# Patient Record
Sex: Male | Born: 1995 | Hispanic: Yes | Marital: Married | Smoking: Never smoker
Health system: Southern US, Community
[De-identification: ages and names within clinical notes are randomized; demographics above are authoritative.]

## PROBLEM LIST (undated history)

## (undated) DIAGNOSIS — J45909 Unspecified asthma, uncomplicated: Secondary | ICD-10-CM

## (undated) HISTORY — PX: WISDOM TOOTH EXTRACTION: SHX21

---

## 2017-06-29 ENCOUNTER — Encounter: Payer: Self-pay | Admitting: Emergency Medicine

## 2017-06-29 ENCOUNTER — Other Ambulatory Visit: Payer: Self-pay

## 2017-06-29 ENCOUNTER — Emergency Department
Admission: EM | Admit: 2017-06-29 | Discharge: 2017-06-29 | Disposition: A | Discharge status: Home / Self Care | Payer: Self-pay | Attending: Emergency Medicine | Admitting: Emergency Medicine

## 2017-06-29 ENCOUNTER — Emergency Department: Payer: Self-pay

## 2017-06-29 DIAGNOSIS — N50812 Left testicular pain: Secondary | ICD-10-CM | POA: Insufficient documentation

## 2017-06-29 LAB — URINALYSIS, COMPLETE (UACMP) WITH MICROSCOPIC
BILIRUBIN URINE: NEGATIVE
Glucose, UA: NEGATIVE mg/dL
Hgb urine dipstick: NEGATIVE
Ketones, ur: NEGATIVE mg/dL
Leukocytes, UA: NEGATIVE
NITRITE: NEGATIVE
PH: 6 (ref 5.0–8.0)
Protein, ur: NEGATIVE mg/dL
SPECIFIC GRAVITY, URINE: 1.025 (ref 1.005–1.030)

## 2017-06-29 LAB — CHLAMYDIA/NGC RT PCR (ARMC ONLY)
CHLAMYDIA TR: NOT DETECTED
N gonorrhoeae: NOT DETECTED

## 2017-06-29 NOTE — ED Notes (Signed)

## 2017-06-29 NOTE — Discharge Instructions (Addendum)
Sometimes when you have pain in the testicle it could be that the testicle has twisted and cut off the blood flow.  If you develop bad pain in the testicle again do not wait more than 10 or 15 minutes to see if it gets better.  Please come right to the emergency room and have another ultrasound and make sure there is no torsion or twisting of the testicle.  If you had a testicular torsion and it was not fixed within a few hours the testicle could die.  Right now there is no sign of anything wrong going on.  Just return as needed.

## 2017-06-29 NOTE — ED Notes (Signed)
First Nurse Note: Patient to US via WC. 

## 2017-06-29 NOTE — ED Triage Notes (Signed)
Pt states that he has been having pain in his left testicle since Tuesday, initially the pain was much worse. Pt concerned that it is still having pain

## 2017-06-29 NOTE — ED Provider Notes (Addendum)
[facility] Emergency Department Provider Note   ____________________________________________   First MD Initiated Contact with Patient 06/29/17 1125     (approximate)  I have reviewed the triage vital signs and the nursing notes.   HISTORY  Chief Complaint Testicle Pain   HPI Seth Dixon is a 22 y.o. male who reports sudden onset of severe pain in his left testicle Tuesday as he was taking off his pants from work.  It has since resolved.  There is no pain anymore.  Is no dysuria no other complaints ultrasound of the testicle was normal urinalysis is essentially normal   History reviewed. No pertinent past medical history.  There are no active problems to display for this patient.   History reviewed. No pertinent surgical history.  Prior to Admission medications   Not on File    Allergies Patient has no known allergies.  History reviewed. No pertinent family history.  Social History Social History   Tobacco Use  . Smoking status: Not on file  Substance Use Topics  . Alcohol use: Not on file  . Drug use: Not on file    Review of Systems  Constitutional: No fever/chills Eyes: No visual changes. ENT: No sore throat. Cardiovascular: Denies chest pain. Respiratory: Denies shortness of breath. Gastrointestinal: No abdominal pain.  No nausea, no vomiting.  No diarrhea.  No constipation. Genitourinary: Negative for dysuria. Musculoskeletal: Negative for back pain. Skin: Negative for rash. Neurological: Negative for headaches, focal weakness ____________________________________________   PHYSICAL EXAM:  VITAL SIGNS: ED Triage Vitals  Enc Vitals Group     BP 06/29/17 0937 (!) 112/47     Pulse Rate 06/29/17 0937 68     Resp 06/29/17 0937 18     Temp 06/29/17 0937 98 F (36.7 C)     Temp Source 06/29/17 0937 Oral     SpO2 06/29/17 0937 100 %     Weight 06/29/17 0938 202 lb (91.6 kg)     Height 06/29/17 0938 5\' 7"  (1.702 m)      Head Circumference --      Peak Flow --      Pain Score 06/29/17 0938 3     Pain Loc --      Pain Edu? --      Excl. in GC? --     Constitutional: Alert and oriented. Well appearing and in no acute distress. Eyes: Conjunctivae are normal.  Head: Atraumatic. Nose: No congestion/rhinnorhea. Mouth/Throat: Mucous membranes are moist.   Neck: No stridor.   Cardiovascular:  Good peripheral circulation. Respiratory: Normal respiratory effort.  No retractions. Gastrointestinal: Soft and nontender.  No masses no CVA tenderness Genitourinary: Normal uncircumcised male normal penis normal testicles not tender no masses Musculoskeletal: No lower extremity tenderness nor edema.   Neurologic:  Normal speech and language. No gross focal neurologic deficits are appreciated. Skin:  Skin is warm, dry and intact. No rash noted. Psychiatric: Mood and affect are normal. Speech and behavior are normal.  ____________________________________________   LABS (all labs ordered are listed, but only abnormal results are displayed)  Labs Reviewed  URINALYSIS, COMPLETE (UACMP) WITH MICROSCOPIC - Abnormal; Notable for the following components:      Result Value   Color, Urine YELLOW (*)    APPearance HAZY (*)    All other components within normal limits  CHLAMYDIA/NGC RT PCR ([facility] ONLY)   ____________________________________________  EKG   ____________________________________________  RADIOLOGY  ED MD interpretation: Essentially normal testicular ultrasound  Official radiology  report(s): Koreas Scrotum W/doppler  Result Date: 06/29/2017 CLINICAL DATA:  Left testicle pain. EXAM: SCROTAL ULTRASOUND DOPPLER ULTRASOUND OF THE TESTICLES TECHNIQUE: Complete ultrasound examination of the testicles, epididymis, and other scrotal structures was performed. Color and spectral Doppler ultrasound were also utilized to evaluate blood flow to the testicles. COMPARISON:  None. FINDINGS: Right testicle  Measurements: 3.9 x 2.6 x 3.4 cm. No mass or microlithiasis visualized. Left testicle Measurements: 5.2 x 2.4 x 3.1 cm. No mass visualized. Single punctate 2 mm microlith within the testicle. Right epididymis:  Normal in size and appearance. Left epididymis:  Normal in size and appearance. Hydrocele:  None visualized. Varicocele:  Small left varicocele. Pulsed Doppler interrogation of both testes demonstrates normal low resistance arterial and venous waveforms bilaterally. IMPRESSION: 1. No acute abnormality. 2. Small left varicocele. Electronically Signed   By: Obie DredgeWilliam T Derry M.D.   On: 06/29/2017 10:44    ____________________________________________   PROCEDURES  Procedure(s) performed:   Procedures  Critical Care performed:   ____________________________________________   INITIAL IMPRESSION / ASSESSMENT AND PLAN / ED COURSE           ____________________________________________   FINAL CLINICAL IMPRESSION(S) / ED DIAGNOSES  Final diagnoses:  Testicular pain, left     ED Discharge Orders    None       Note:  This document was prepared using Dragon voice recognition software and may include unintentional dictation errors.    Arnaldo NatalMalinda, Zniya Cottone F, MD 06/29/17 1143    Arnaldo NatalMalinda, Halea Lieb F, MD 06/29/17 1145

## 2017-11-01 ENCOUNTER — Other Ambulatory Visit: Payer: Self-pay

## 2017-11-01 ENCOUNTER — Ambulatory Visit
Admission: EM | Admit: 2017-11-01 | Discharge: 2017-11-01 | Disposition: A | Discharge status: Home / Self Care | Payer: Self-pay | Attending: Family Medicine | Admitting: Family Medicine

## 2017-11-01 ENCOUNTER — Encounter: Payer: Self-pay | Admitting: Emergency Medicine

## 2017-11-01 DIAGNOSIS — R062 Wheezing: Secondary | ICD-10-CM

## 2017-11-01 DIAGNOSIS — J069 Acute upper respiratory infection, unspecified: Secondary | ICD-10-CM

## 2017-11-01 HISTORY — DX: Unspecified asthma, uncomplicated: J45.909

## 2017-11-01 MED ORDER — AZITHROMYCIN 250 MG PO TABS: ORAL_TABLET | ORAL | 0 refills | Status: DC

## 2017-11-01 MED ORDER — ALBUTEROL SULFATE HFA 108 (90 BASE) MCG/ACT IN AERS: 2.0000 | INHALATION_SPRAY | RESPIRATORY_TRACT | 0 refills | Status: DC | PRN

## 2017-11-01 MED ORDER — IPRATROPIUM-ALBUTEROL 0.5-2.5 (3) MG/3ML IN SOLN
3.0000 mL | Freq: Once | RESPIRATORY_TRACT | Status: AC
  Administered 2017-11-01: 3 mL via RESPIRATORY_TRACT

## 2017-11-01 MED ORDER — PREDNISONE 10 MG PO TABS: ORAL_TABLET | ORAL | 0 refills | Status: DC

## 2017-11-01 NOTE — Discharge Instructions (Addendum)
Take medication as prescribed. Rest. Drink plenty of fluids.  ° °Follow up with your primary care physician this week as needed. Return to Urgent care for new or worsening concerns.  ° °

## 2017-11-01 NOTE — ED Provider Notes (Signed)
[facility] ____________________________________________  Time seen: Approximately 12:25 PM  I have reviewed the triage vital signs and the nursing notes.   HISTORY  Chief Complaint Cough  HPI Seth Dixon is a 22 y.o. male presenting for evaluation of 8 days of cough, nasal congestion and nasal drainage.  States he has felt like he has intermittently heard some wheezing in his chest of the last few days.  History of asthma, but states his asthma has not bothered him in a few years.  Reports one day last week he was coughing so much he would intermittent vomit due to coughing and gagging.  Denies known fevers, but reports some intermittent chills.  States having a lot of thick nasal drainage as well as postnasal drainage.  Has continued to remain active this week.  States has not had any other vomiting episodes.  Denies associated abdominal pain, diarrhea or constipation.  Has continued to remain active this week.  States some sinus pressure sensation.  Denies pain at this time.  Occasional sore throat.  Denies chest pain, shortness of breath, abdominal pain.  Denies recent sickness or recent antibiotic use.     Past Medical History:  Diagnosis Date  . Asthma    childhood    There are no active problems to display for this patient.   Past Surgical History:  Procedure Laterality Date  . WISDOM TOOTH EXTRACTION       No current facility-administered medications for this encounter.   Current Outpatient Medications:  .  albuterol (PROVENTIL HFA;VENTOLIN HFA) 108 (90 Base) MCG/ACT inhaler, Inhale 2 puffs into the lungs every 4 (four) hours as needed for wheezing., Disp: 1 Inhaler, Rfl: 0 .  azithromycin (ZITHROMAX Z-PAK) 250 MG tablet, Take 2 tablets (500 mg) on  Day 1,  followed by 1 tablet (250 mg) once daily on Days 2 through 5., Disp: 6 each, Rfl: 0 .  predniSONE (DELTASONE) 10 MG tablet, Start 60 mg po day one, then 50 mg po day two, taper by 10 mg daily until  complete., Disp: 21 tablet, Rfl: 0  Allergies Patient has no known allergies.  Family History  Problem Relation Age of Onset  . Kidney Stones Mother   . Healthy Father     Social History Social History   Tobacco Use  . Smoking status: Never Smoker  . Smokeless tobacco: Never Used  Substance Use Topics  . Alcohol use: Never    Frequency: Never  . Drug use: Never    Review of Systems Constitutional: As above.  Eyes: No visual changes. ENT: As above.  Cardiovascular: Denies chest pain. Respiratory: Denies shortness of breath. Gastrointestinal: No abdominal pain.  No nausea, no vomiting.  No diarrhea.  No constipation. Genitourinary: Negative for dysuria. Musculoskeletal: Negative for back pain. Skin: Negative for rash.   ____________________________________________   PHYSICAL EXAM:  VITAL SIGNS: ED Triage Vitals  Enc Vitals Group     BP 11/01/17 1209 130/79     Pulse Rate 11/01/17 1209 77     Resp 11/01/17 1209 16     Temp 11/01/17 1209 98.5 F (36.9 C)     Temp Source 11/01/17 1209 Oral     SpO2 11/01/17 1209 100 %     Weight 11/01/17 1208 215 lb (97.5 kg)     Height 11/01/17 1208 5\' 7"  (1.702 m)     Head Circumference --      Peak Flow --      Pain Score 11/01/17 1208 0  Pain Loc --      Pain Edu? --      Excl. in GC? --     Constitutional: Alert and oriented. Well appearing and in no acute distress. Eyes: Conjunctivae are normal.  Head: Atraumatic. No sinus tenderness to palpation. No swelling. No erythema.  Ears: no erythema, normal TMs bilaterally.   Nose:Nasal congestion with clear rhinorrhea  Mouth/Throat: Mucous membranes are moist. No pharyngeal erythema. No tonsillar swelling or exudate.  Neck: No stridor.  No cervical spine tenderness to palpation. Hematological/Lymphatic/Immunilogical: No cervical lymphadenopathy. Cardiovascular: Normal rate, regular rhythm. Grossly normal heart sounds.  Good peripheral circulation. Respiratory: Normal  respiratory effort.  No retractions.  Mild scattered rhonchi.  Scattered wheezes.  Good air movement.  Intermittent cough with bronchospasm.  Speaks in complete sentences. Gastrointestinal: Soft and nontender. Musculoskeletal: Ambulatory with steady gait. No cervical, thoracic or lumbar tenderness to palpation. Neurologic:  Normal speech and language. No gait instability. Skin:  Skin appears warm, dry and intact. No rash noted. Psychiatric: Mood and affect are normal. Speech and behavior are normal.  ___________________________________________   LABS (all labs ordered are listed, but only abnormal results are displayed)  Labs Reviewed - No data to display  PROCEDURES Procedures    INITIAL IMPRESSION / ASSESSMENT AND PLAN / ED COURSE  Pertinent labs & imaging results that were available during my care of the patient were reviewed by me and considered in my medical decision making (see chart for details).  Well appearing patient.  No acute distress.  History of asthma.  Patient with recent suspected viral upper respiratory infection with continued cough, wheezes noted.  Albuterol ipratropium DuoNeb given once in urgent care, post duo neb reevaluated, rhonchi resolved, wheezes improved.  Will treat with prednisone taper, albuterol inhaler and oral azithromycin.  Encourage rest, fluids, supportive care.  Discussed strict follow-up and return parameters.  Will defer chest x-ray at this time, patient agrees.  Work note given for today and tomorrow.Discussed indication, risks and benefits of medications with patient.  Discussed follow up with Primary care physician this week. Discussed follow up and return parameters including no resolution or any worsening concerns. Patient verbalized understanding and agreed to plan.   ____________________________________________   FINAL CLINICAL IMPRESSION(S) / ED DIAGNOSES  Final diagnoses:  Acute upper respiratory infection  Wheezing     ED  Discharge Orders         Ordered    albuterol (PROVENTIL HFA;VENTOLIN HFA) 108 (90 Base) MCG/ACT inhaler  Every 4 hours PRN     11/01/17 1259    azithromycin (ZITHROMAX Z-PAK) 250 MG tablet     11/01/17 1259    predniSONE (DELTASONE) 10 MG tablet     11/01/17 1259           Note: This dictation was prepared with Dragon dictation along with smaller phrase technology. Any transcriptional errors that result from this process are unintentional.         Renford Dills, NP 11/01/17 1310

## 2017-11-01 NOTE — ED Triage Notes (Signed)
Patient in today c/o cough x 8 days. Patient states he has had fever and chills.

## 2019-06-16 ENCOUNTER — Ambulatory Visit: Payer: Self-pay

## 2021-07-30 ENCOUNTER — Emergency Department
Admission: EM | Admit: 2021-07-30 | Discharge: 2021-07-30 | Disposition: A | Discharge status: Home / Self Care | Payer: Self-pay | Attending: Emergency Medicine | Admitting: Emergency Medicine

## 2021-07-30 ENCOUNTER — Other Ambulatory Visit: Payer: Self-pay

## 2021-07-30 ENCOUNTER — Emergency Department: Payer: Self-pay

## 2021-07-30 DIAGNOSIS — J45909 Unspecified asthma, uncomplicated: Secondary | ICD-10-CM | POA: Insufficient documentation

## 2021-07-30 DIAGNOSIS — R109 Unspecified abdominal pain: Secondary | ICD-10-CM

## 2021-07-30 DIAGNOSIS — N200 Calculus of kidney: Secondary | ICD-10-CM

## 2021-07-30 DIAGNOSIS — R1032 Left lower quadrant pain: Secondary | ICD-10-CM | POA: Insufficient documentation

## 2021-07-30 LAB — BASIC METABOLIC PANEL
Anion gap: 10 (ref 5–15)
BUN: 17 mg/dL (ref 6–20)
CO2: 23 mmol/L (ref 22–32)
Calcium: 9.2 mg/dL (ref 8.9–10.3)
Chloride: 103 mmol/L (ref 98–111)
Creatinine, Ser: 0.92 mg/dL (ref 0.61–1.24)
GFR, Estimated: >60 mL/min (ref >60)
Glucose, Bld: 112 mg/dL — ABNORMAL HIGH (ref 70–99)
Potassium: 3.2 mmol/L — ABNORMAL LOW (ref 3.5–5.1)
Sodium: 136 mmol/L (ref 135–145)

## 2021-07-30 LAB — URINALYSIS, ROUTINE W REFLEX MICROSCOPIC
Bacteria, UA: NONE SEEN
Bilirubin Urine: NEGATIVE
Glucose, UA: NEGATIVE mg/dL
Ketones, ur: NEGATIVE mg/dL
Leukocytes,Ua: NEGATIVE
Nitrite: NEGATIVE
Protein, ur: NEGATIVE mg/dL
Specific Gravity, Urine: 1.016 (ref 1.005–1.030)
Squamous Epithelial / LPF: NONE SEEN (ref 0–5)
pH: 6 (ref 5.0–8.0)

## 2021-07-30 LAB — CBC
HCT: 47.8 % (ref 39.0–52.0)
Hemoglobin: 16.3 g/dL (ref 13.0–17.0)
MCH: 28.6 pg (ref 26.0–34.0)
MCHC: 34.1 g/dL (ref 30.0–36.0)
MCV: 83.9 fL (ref 80.0–100.0)
Platelets: 283 10*3/uL (ref 150–400)
RBC: 5.7 MIL/uL (ref 4.22–5.81)
RDW: 12.1 % (ref 11.5–15.5)
WBC: 8.8 10*3/uL (ref 4.0–10.5)
nRBC: 0 % (ref 0.0–0.2)

## 2021-07-30 LAB — HEPATIC FUNCTION PANEL
ALT: 38 U/L (ref 0–44)
AST: 33 U/L (ref 15–41)
Albumin: 4.5 g/dL (ref 3.5–5.0)
Alkaline Phosphatase: 84 U/L (ref 38–126)
Bilirubin, Direct: <0.1 mg/dL (ref 0.0–0.2)
Total Bilirubin: 0.7 mg/dL (ref 0.3–1.2)
Total Protein: 7.8 g/dL (ref 6.5–8.1)

## 2021-07-30 LAB — LIPASE, BLOOD: Lipase: 32 U/L (ref 11–51)

## 2021-07-30 MED ORDER — FENTANYL CITRATE PF 50 MCG/ML IJ SOSY
50.0000 ug | PREFILLED_SYRINGE | Freq: Once | INTRAMUSCULAR | Status: AC
  Administered 2021-07-30: 50 ug via INTRAVENOUS
  Filled 2021-07-30: qty 1

## 2021-07-30 MED ORDER — ONDANSETRON HCL 4 MG/2ML IJ SOLN
4.0000 mg | Freq: Once | INTRAMUSCULAR | Status: AC
  Administered 2021-07-30: 4 mg via INTRAVENOUS
  Filled 2021-07-30: qty 2

## 2021-07-30 MED ORDER — ONDANSETRON 4 MG PO TBDP: 4.0000 mg | ORAL_TABLET | Freq: Four times a day (QID) | ORAL | 0 refills | Status: DC | PRN

## 2021-07-30 MED ORDER — HYDROCODONE-ACETAMINOPHEN 5-325 MG PO TABS: 1.0000 | ORAL_TABLET | Freq: Four times a day (QID) | ORAL | 0 refills | Status: DC | PRN

## 2021-07-30 NOTE — Discharge Instructions (Addendum)
No driving today.  No working in dangerous areas or with heavy equipment or driving within 8 hours of use of hydrocodone.  Additionally, please follow-up with one of our gastroenterology specialists.  You have a slightly unusual appearance of your liver, and we recommend you have further evaluation.  Recommend that you follow-up with our gastroenterology specialist and consider having an MRI of your liver to further evaluate.

## 2021-07-30 NOTE — ED Provider Notes (Signed)
Vitals:   07/30/21 0700 07/30/21 0800  BP: 110/61   Pulse: (!) 57 63  Resp: 15 17  Temp:    SpO2: 96% 98%      ----------------------------------------- 8:37 AM on 07/30/2021 -----------------------------------------  CT Renal Stone Study  Result Date: 07/30/2021 CLINICAL DATA:  26 year old male with flank pain. EXAM: CT ABDOMEN AND PELVIS WITHOUT CONTRAST TECHNIQUE: Multidetector CT imaging of the abdomen and pelvis was performed following the standard protocol without IV contrast. RADIATION DOSE REDUCTION: This exam was performed according to the departmental dose-optimization program which includes automated exposure control, adjustment of the mA and/or kV according to patient size and/or use of iterative reconstruction technique. COMPARISON:  Scrotal Doppler ultrasound 06/29/2017. FINDINGS: Lower chest: Cardiac size is at the upper limits of normal. No pericardial or pleural effusion. Mild dependent atelectasis in both lungs. Hepatobiliary: Geographic but also somewhat rounded, masslike hypodensity in the right hepatic lobe (coronal image 51) but probably geographic hepatic steatosis. Sparing at the gallbladder fossa. No perihepatic free fluid. No liver nodularity. Negative gallbladder. No bile duct enlargement. Pancreas: Negative noncontrast pancreas. Spleen: Negative. Adrenals/Urinary Tract: Normal adrenal glands. Nonobstructed kidneys. No nephrolithiasis. Both ureters are decompressed. However, there is a punctate calculus layering in the urinary bladder just to the right of midline on series 2, image 86. Ureterovesical junctions appear negative. Bladder otherwise appears negative. Stomach/Bowel: Negative. Normal appendix on coronal image 48. No dilated bowel. No free air or free fluid. Decompressed stomach. Vascular/Lymphatic: Normal caliber abdominal aorta. No calcified atherosclerosis or lymphadenopathy identified. Reproductive: Negative. Other: No pelvic free fluid. There is a small fat  containing paraumbilical hernia (sagittal image 70). Musculoskeletal: Negative. IMPRESSION: 1. No obstructive uropathy but a dependent punctate calculus within the bladder could be a recently passed stone. No other urinary calculus identified. 2. Heterogeneous Liver, with geographic but also somewhat rounded, mass-like hypodensity in the right lobe (coronal image 51). Legrand Rams this is geographic Hepatic Steatosis but recommend routine outpatient Abdomen MRI (liver protocol without and with contrast) to best characterize. 3. No other acute or inflammatory process identified in the noncontrast abdomen or pelvis. Normal appendix. Small paraumbilical hernia containing fat. Electronically Signed   By: Odessa Fleming M.D.   On: 07/30/2021 07:24    LFTs normal.  Patient understanding agreeable with plan to follow-up with both gastroenterology as well as urology.  Patient resting comfortably.  Her notes reports no ongoing pain or discomfort.  Feels much improved.  Based on CT imaging the clinical history suspect he likely has passed a stone from the ureter into the bladder.  Expectant management.  Urinalysis without evidence of infection pain and symptoms well controlled.  Unable to electronically transmit his hydrocodone prescription, printed copy provided. I will prescribe the patient a narcotic pain medicine due to their condition which I anticipate will cause at least moderate pain short term. I discussed with the patient safe use of narcotic pain medicines, and that they are not to drive, work in dangerous areas, or ever take more than prescribed (no more than 1 pill every 6 hours). We discussed that this is the type of medication that can be  overdosed on and the risks of this type of medicine. Patient is very agreeable to only use as prescribed and to never use more than prescribed.  Return precautions and treatment recommendations and follow-up discussed with the patient who is agreeable with the plan.     Sharyn Creamer, MD 07/30/21 [phone]

## 2021-07-30 NOTE — ED Notes (Signed)
Pt to CT

## 2021-07-30 NOTE — ED Provider Notes (Signed)
   [facility] Provider Note    Event Date/Time   First MD Initiated Contact with Patient 07/30/21 [phone]     (approximate)  History   Chief Complaint: Abdominal Pain and Testicle Pain  HPI  Marvion Bastidas is a 26 y.o. male with a past medical history of asthma who presents to the emergency department with acute onset of left flank pain.  According to the patient around 4:00 in the morning and he developed acute onset of left lower quadrant/left flank pain.  Patient states the pain radiated to the left testicle and he felt like he needed to urinate.  Patient denies any dysuria denies any hematuria.  No history of kidney stones.  Patient received pain medication in the waiting room, he is currently in the room when I saw him he states the pain has completely resolved at this time.  Physical Exam   Triage Vital Signs: ED Triage Vitals  Enc Vitals Group     BP 07/30/21 0533 (!) 153/100     Pulse Rate 07/30/21 0533 76     Resp 07/30/21 0533 20     Temp 07/30/21 0533 97.7 F (36.5 C)     Temp Source 07/30/21 0533 Oral     SpO2 07/30/21 0533 96 %     Weight 07/30/21 0534 240 lb (108.9 kg)     Height 07/30/21 0534 5\' 7"  (1.702 m)     Head Circumference --      Peak Flow --      Pain Score 07/30/21 0534 10     Pain Loc --      Pain Edu? --      Excl. in GC? --     Most recent vital signs: Vitals:   07/30/21 0533  BP: (!) 153/100  Pulse: 76  Resp: 20  Temp: 97.7 F (36.5 C)  SpO2: 96%    General: Awake, no distress.  CV:  Good peripheral perfusion.  Regular rate and rhythm  Resp:  Normal effort.  Equal breath sounds bilaterally.  Abd:  No distention.  Soft, nontender.  No rebound or guarding.    ED Results / Procedures / Treatments   RADIOLOGY  CT scan pending   MEDICATIONS ORDERED IN ED: Medications  fentaNYL (SUBLIMAZE) injection 50 mcg (50 mcg Intravenous Given 07/30/21 0548)  ondansetron (ZOFRAN) injection 4 mg (4 mg Intravenous Given  07/30/21 0547)     IMPRESSION / MDM / ASSESSMENT AND PLAN / ED COURSE  I reviewed the triage vital signs and the nursing notes.  Patient presents emergency department cute onset of left flank pain radiating to the left testicle along with a sensation that he needs to urinate.  Overall the patient appears well, no distress.  Symptoms are suspicious for ureterolithiasis, differential would also include torsion/detorsion, UTI or pyelonephritis, intestinal pain.  We will obtain a CT renal scan labs and urine.  Patient agreeable to plan.  After pain medication patient denies any pain currently.  Patient's CBC and BMP are largely within normal limits including normal renal function.  Normal white blood cell count.  Urinalysis and CT scan pending.  Patient care signed out to oncoming provider.  Patient remains pain-free currently.  FINAL CLINICAL IMPRESSION(S) / ED DIAGNOSES   Left flank pain    Note:  This document was prepared using Dragon voice recognition software and may include unintentional dictation errors.   08/01/21, MD 07/30/21 [phone]

## 2021-07-30 NOTE — ED Triage Notes (Signed)
Pt presents to ER c/o testicular pain that started around 0545 suddenly this morning.  Pt states pain has since moved to LLQ of abdomen.  Pt denies any trauma to testicles, but states he has been playing soccer and basketball yesterday.  Pt c/o difficulty urinating, but feeling the urge.  Pt A&O x4 at this time.  Pt appears extremely uncomfortable.

## 2021-12-08 ENCOUNTER — Encounter: Payer: Self-pay | Admitting: Gastroenterology

## 2021-12-08 ENCOUNTER — Ambulatory Visit (INDEPENDENT_AMBULATORY_CARE_PROVIDER_SITE_OTHER): Payer: Self-pay | Admitting: Gastroenterology

## 2021-12-08 VITALS — BP 145/89 | HR 86 | Temp 98.6°F | Ht 67.0 in | Wt 225.0 lb

## 2021-12-08 DIAGNOSIS — R932 Abnormal findings on diagnostic imaging of liver and biliary tract: Secondary | ICD-10-CM

## 2021-12-08 NOTE — Patient Instructions (Signed)
Please arrive to the [facility] at 10:30 AM. Nothing to eat 4 hours prior.

## 2021-12-08 NOTE — Progress Notes (Signed)
[name] MD, MRCP(U.K) [address]  [address]  [place], [place] [address]  Main: [phone]  Fax: [phone]   Gastroenterology Consultation  Referring Provider:     [facility], [facility] Primary Care Physician:  [facility], [facility] Primary Gastroenterologist:  Dr. [name]  Reason for Consultation:   Abnormal Ct scan         HPI:   Seth Dixon is a 26 y.o. y/o male who recently underwent a CT scan renal stone study for flank pain and was found to have acute calculus of the gallbladder but incidentally found was masslike hypodensity in the right lobe of the liver further evaluation with an MRI was recommended.  At the same time liver function tests were checked which were normal hemoglobin 16.3 g and BMP was normal.  He denies any abdominal pain.  No GI complaints.  No family history of liver disease.  Denies any excess alcohol intake.  Past Medical History:  Diagnosis Date   Asthma    childhood    Past Surgical History:  Procedure Laterality Date   WISDOM TOOTH EXTRACTION      Prior to Admission medications   Medication Sig Start Date End Date Taking? Authorizing Provider  albuterol (PROVENTIL HFA;VENTOLIN HFA) 108 (90 Base) MCG/ACT inhaler Inhale 2 puffs into the lungs every 4 (four) hours as needed for wheezing. 11/01/17   Renford Dills, NP  azithromycin (ZITHROMAX Z-PAK) 250 MG tablet Take 2 tablets (500 mg) on  Day 1,  followed by 1 tablet (250 mg) once daily on Days 2 through 5. 11/01/17   Renford Dills, NP  HYDROcodone-acetaminophen (NORCO/VICODIN) 5-325 MG tablet Take 1-2 tablets by mouth every 6 (six) hours as needed for moderate pain. 07/30/21   Sharyn Creamer, MD  ondansetron (ZOFRAN-ODT) 4 MG disintegrating tablet Take 1 tablet (4 mg total) by mouth every 6 (six) hours as needed for nausea or vomiting. 07/30/21   Sharyn Creamer, MD  predniSONE (DELTASONE) 10 MG tablet Start 60 mg po day one, then 50 mg po day two, taper by 10  mg daily until complete. 11/01/17   Renford Dills, NP    Family History  Problem Relation Age of Onset   Kidney Stones Mother    Healthy Father      Social History   Tobacco Use   Smoking status: Never   Smokeless tobacco: Never  Vaping Use   Vaping Use: Never used  Substance Use Topics   Alcohol use: Never   Drug use: Never    Allergies as of 12/08/2021   (No Known Allergies)    Review of Systems:    All systems reviewed and negative except where noted in HPI.   Physical Exam:  BP (!) 145/89   Pulse 86   Temp 98.6 F (37 C) (Oral)   Ht 5\' 7"  (1.702 m)   Wt 225 lb (102.1 kg)   BMI 35.24 kg/m  No LMP for male patient. Psych:  Alert and cooperative. Normal mood and affect. General:   Alert,  Well-developed, well-nourished, pleasant and cooperative in NAD Head:  Normocephalic and atraumatic. Eyes:  Sclera clear, no icterus.   Conjunctiva pink. Neurologic:  Alert and oriented x3;  grossly normal neurologically. Psych:  Alert and cooperative. Normal mood and affect.  Imaging Studies: No results found.  Assessment and Plan:   Seth Dixon is a 26 y.o. y/o male has been referred for further evaluation of a masslike lesion seen incidentally  on the CT scan renal stone protocol with the patient presented to the ER with flank pain.  Liver function tests have been normal.  Further evaluation with an MRI was recommended by the radiologist.  Plan 1.MRI (liver protocol without and with contrast) to evaluate masslike lesion on the liver  Follow up in 6 weeks video visit  Dr Jonathon Bellows MD,MRCP(U.K)

## 2021-12-09 ENCOUNTER — Ambulatory Visit: Admission: RE | Admit: 2021-12-09 | Payer: Self-pay | Source: Ambulatory Visit

## 2022-02-06 ENCOUNTER — Telehealth: Payer: Self-pay | Admitting: Gastroenterology

## 2022-02-06 NOTE — Progress Notes (Deleted)
    [name] , MD [address]  [address]  [place], [place] [address]  Main: [phone]  Fax: [phone]   Primary Care Physician: No primary care provider on file.  Virtual Visit via Video Note  I connected with patient on 02/06/22 at  1:45 PM EST by video and verified that I am speaking with the correct person using two identifiers.   I discussed the limitations, risks, security and privacy concerns of performing an evaluation and management service by video  and the availability of in person appointments. I also discussed with the patient that there may be a patient responsible charge related to this service. The patient expressed understanding and agreed to proceed.  Location of Patient: Home Location of Provider: Home Persons involved: Patient and provider only   History of Present Illness: No chief complaint on file.   HPI: Seth Dixon is a 26 y.o. male        No current outpatient medications on file.   No current facility-administered medications for this visit.    Allergies as of 02/06/2022   (No Known Allergies)    Review of Systems:    All systems reviewed and negative except where noted in HPI.  General Appearance:    Alert, cooperative, no distress, appears stated age  Head:    Normocephalic, without obvious abnormality, atraumatic  Eyes:    PERRL, conjunctiva/corneas clear,  Ears:    Grossly normal hearing    Neurologic:  Grossly normal    Observations/Objective:  Labs: CMP     Component Value Date/Time   NA 136 07/30/2021 0545   K 3.2 (L) 07/30/2021 0545   CL 103 07/30/2021 0545   CO2 23 07/30/2021 0545   GLUCOSE 112 (H) 07/30/2021 0545   BUN 17 07/30/2021 0545   CREATININE 0.92 07/30/2021 0545   CALCIUM 9.2 07/30/2021 0545   PROT 7.8 07/30/2021 0545   ALBUMIN 4.5 07/30/2021 0545   AST 33 07/30/2021 0545   ALT 38 07/30/2021 0545   ALKPHOS 84 07/30/2021 0545   BILITOT 0.7 07/30/2021 0545   GFRNONAA >60 07/30/2021  0545   Lab Results  Component Value Date   WBC 8.8 07/30/2021   HGB 16.3 07/30/2021   HCT 47.8 07/30/2021   MCV 83.9 07/30/2021   PLT 283 07/30/2021    Imaging Studies: No results found.  Assessment and Plan:   Seth Dixon is a 26 y.o. y/o male ***        I discussed the assessment and treatment plan with the patient. The patient was provided an opportunity to ask questions and all were answered. The patient agreed with the plan and demonstrated an understanding of the instructions.   The patient was advised to call back or seek an in-person evaluation if the symptoms worsen or if the condition fails to improve as anticipated.  I provided *** minutes of face-to-face time during this encounter.  Dr [name] MD,MRCP [facility]) Gastroenterology/Hepatology Pager: [phone]   Speech recognition software was used to dictate this note.

## 2023-08-29 IMAGING — CT CT RENAL STONE PROTOCOL
2 of 4 series · 15 of 46 positions shown, 17 images · non-contrast
Comparison: Scrotal Doppler ultrasound 06/29/2017.

CLINICAL DATA: 25-year-old male with flank pain.



[Series 2: ap without · axial · non-contrast · 0.74mm/px · z∈[-1000,-530]mm · 12 of 106 slices shown, 14 images]
[im 6/106  soft-tissue]
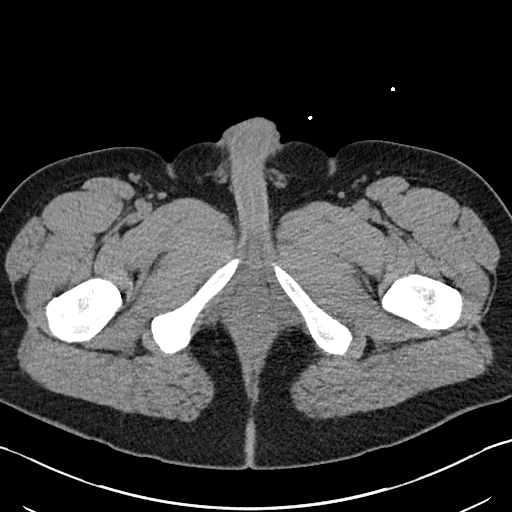
[im 6/106  bone]
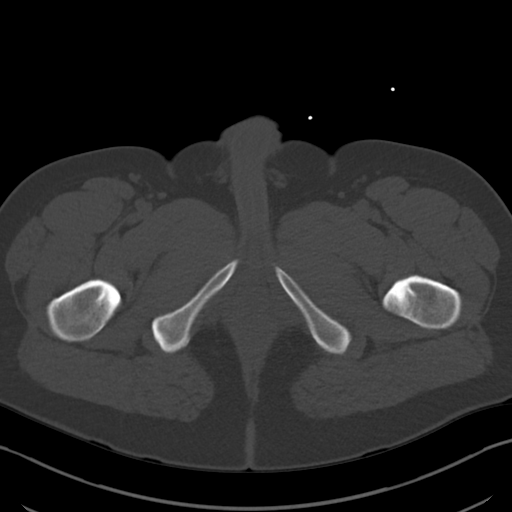
[im 16/106  soft-tissue]
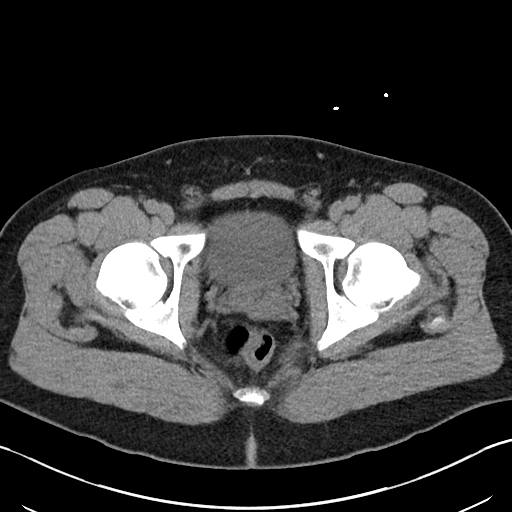
[im 22/106  soft-tissue]
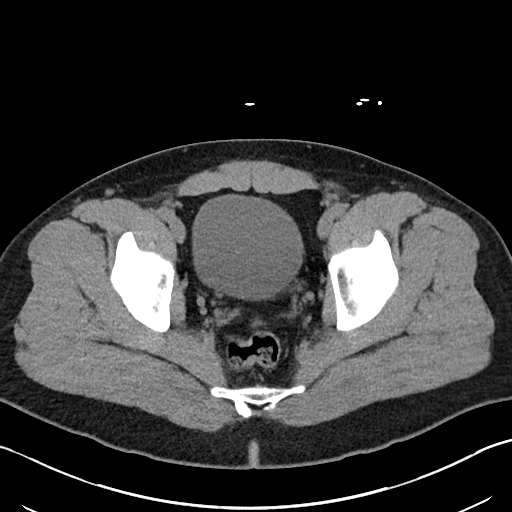
[im 32/106  soft-tissue]
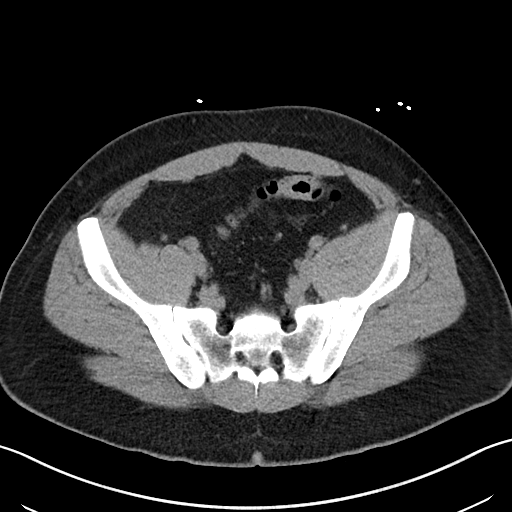
[im 43/106  soft-tissue]
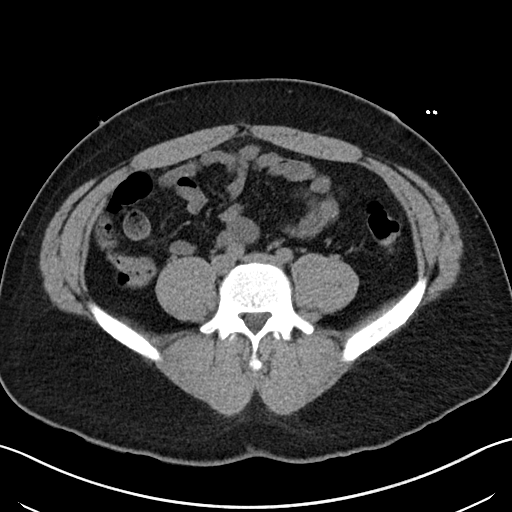
[im 48/106  soft-tissue]
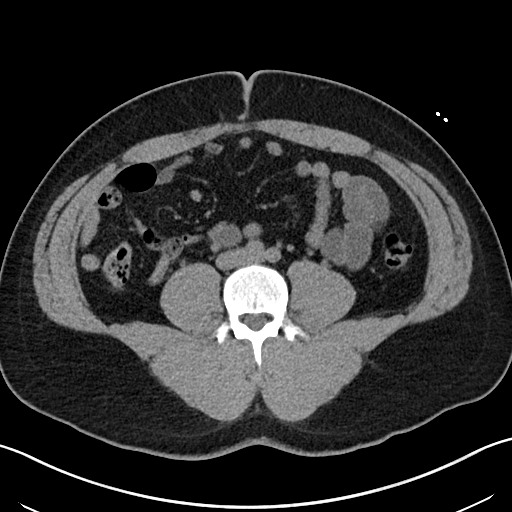
[im 58/106  soft-tissue]
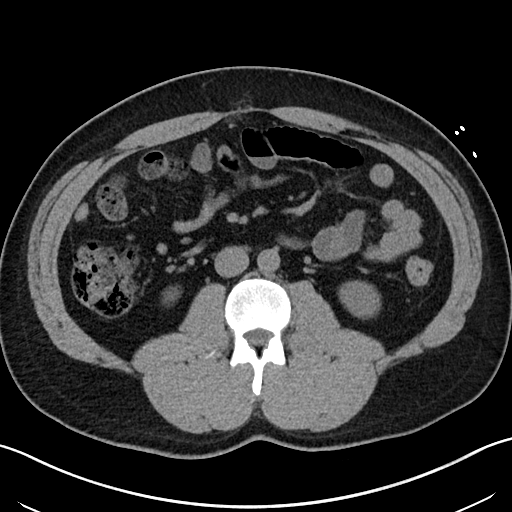
[im 64/106  soft-tissue]
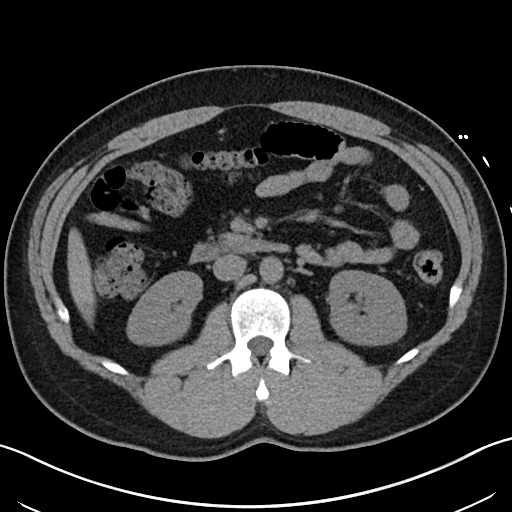
[im 74/106  soft-tissue]
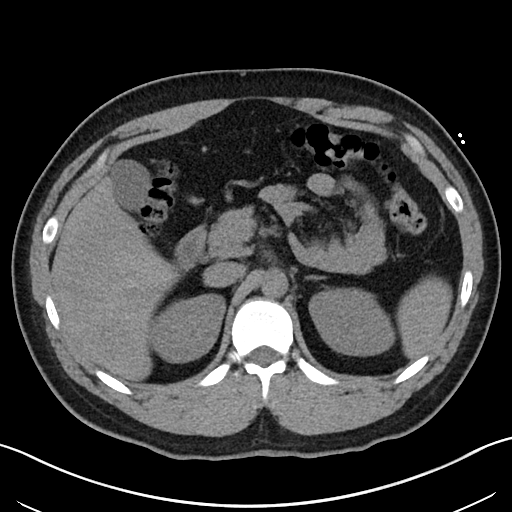
[im 74/106  bone]
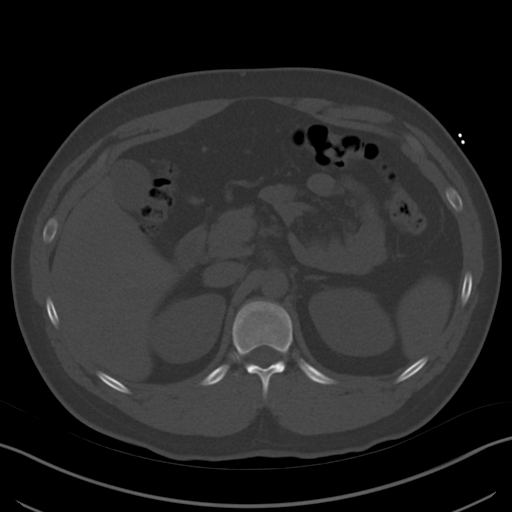
[im 85/106  soft-tissue]
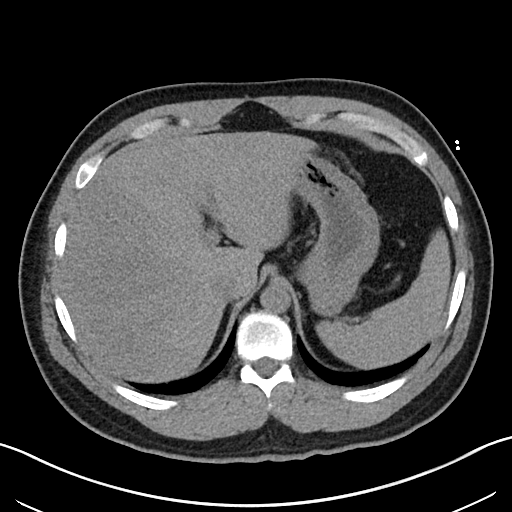
[im 90/106  soft-tissue]
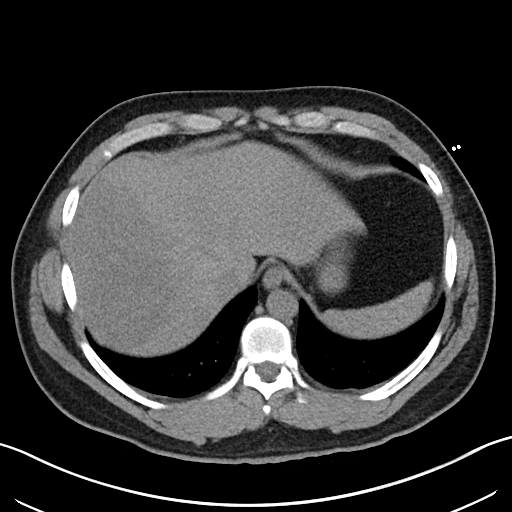
[im 100/106  soft-tissue]
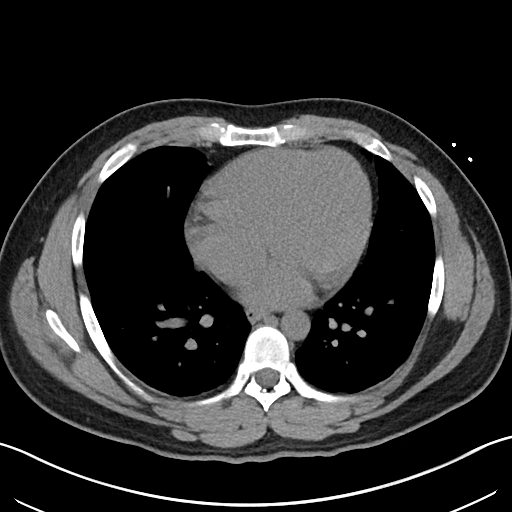

[Series 5: cor · coronal · 0.85mm/px · 3 of 101 slices shown]
[im 34/101  soft-tissue]
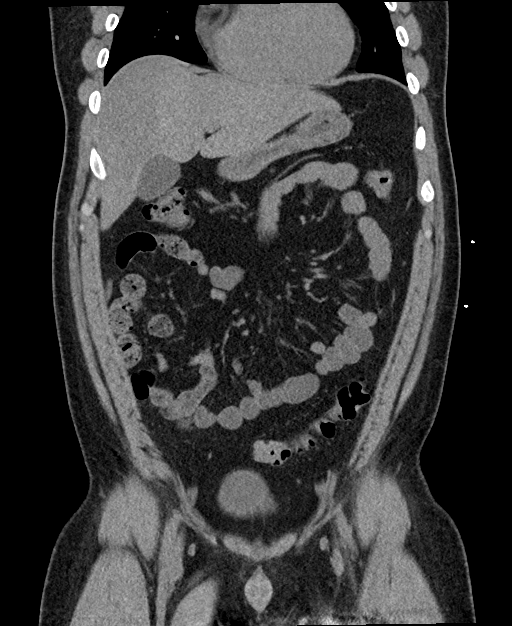
[im 45/101  soft-tissue]
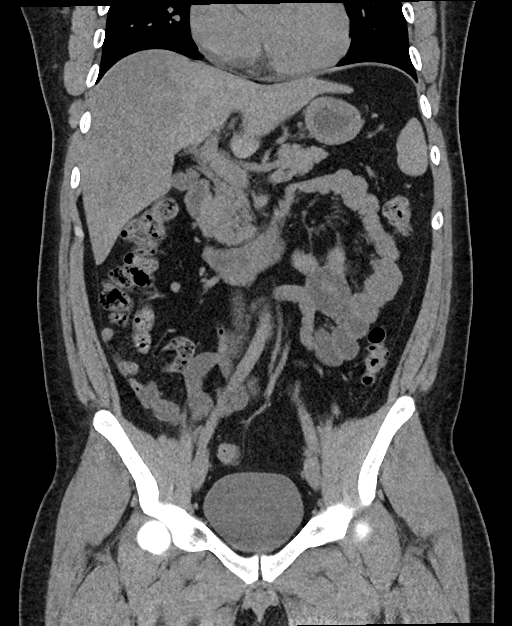
[im 56/101  soft-tissue]
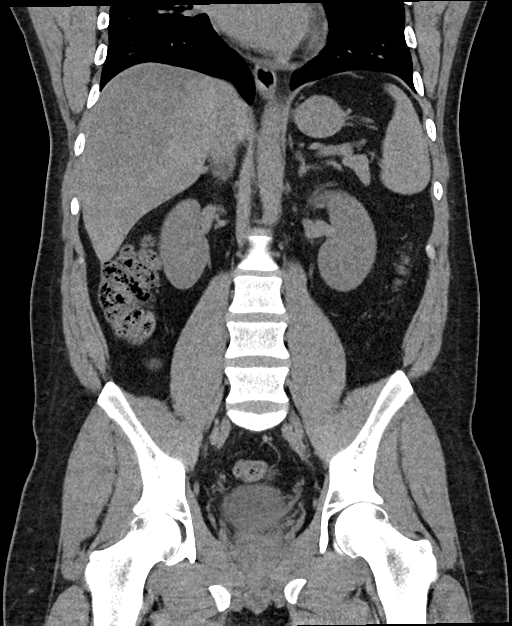

[15 of 46 positions shown; findings below may reference images not displayed]

FINDINGS: Lower chest: Cardiac size is at the upper limits of normal. No
pericardial or pleural effusion. Mild dependent atelectasis in both
lungs.

Hepatobiliary: Geographic but also somewhat rounded, masslike
hypodensity in the right hepatic lobe (coronal image 51) but
probably geographic hepatic steatosis. Sparing at the gallbladder
fossa. No perihepatic free fluid. No liver nodularity. Negative
gallbladder. No bile duct enlargement.

Pancreas: Negative noncontrast pancreas.

Spleen: Negative.

Adrenals/Urinary Tract: Normal adrenal glands. Nonobstructed
kidneys. No nephrolithiasis. Both ureters are decompressed.

However, there is a punctate calculus layering in the urinary
bladder just to the right of midline on series 2, image 86.
Ureterovesical junctions appear negative. Bladder otherwise appears
negative.

Stomach/Bowel: Negative. Normal appendix on coronal image 48. No
dilated bowel. No free air or free fluid. Decompressed stomach.

Vascular/Lymphatic: Normal caliber abdominal aorta. No calcified
atherosclerosis or lymphadenopathy identified.

Reproductive: Negative.

Other: No pelvic free fluid. There is a small fat containing
paraumbilical hernia (sagittal image 70).

Musculoskeletal: Negative.
IMPRESSION: 1. No obstructive uropathy but a dependent punctate calculus within
the bladder could be a recently passed stone. No other urinary
calculus identified.

2. Heterogeneous Liver, with geographic but also somewhat rounded,
mass-like hypodensity in the right lobe (coronal image 51). Favor
this is geographic Hepatic Steatosis but recommend routine
outpatient Abdomen MRI (liver protocol without and with contrast) to
best characterize.

3. No other acute or inflammatory process identified in the
noncontrast abdomen or pelvis. Normal appendix. Small paraumbilical
hernia containing fat.
